# Patient Record
Sex: Male | Born: 1965 | Race: White | Hispanic: No | Marital: Married | Smoking: Former smoker
Health system: Southern US, Community
[De-identification: ages and names within clinical notes are randomized; demographics above are authoritative.]

## PROBLEM LIST (undated history)

## (undated) DIAGNOSIS — E079 Disorder of thyroid, unspecified: Secondary | ICD-10-CM

## (undated) DIAGNOSIS — E7800 Pure hypercholesterolemia, unspecified: Secondary | ICD-10-CM

## (undated) HISTORY — DX: Disorder of thyroid, unspecified: E07.9

---

## 2002-10-29 ENCOUNTER — Encounter: Payer: Self-pay | Admitting: Family Medicine

## 2002-10-29 ENCOUNTER — Ambulatory Visit (HOSPITAL_COMMUNITY): Admission: RE | Admit: 2002-10-29 | Discharge: 2002-10-29 | Payer: Self-pay | Admitting: Family Medicine

## 2005-06-19 ENCOUNTER — Ambulatory Visit (HOSPITAL_COMMUNITY): Admission: RE | Admit: 2005-06-19 | Discharge: 2005-06-19 | Payer: Self-pay | Admitting: Unknown Physician Specialty

## 2006-08-04 ENCOUNTER — Ambulatory Visit (HOSPITAL_COMMUNITY): Admission: RE | Admit: 2006-08-04 | Discharge: 2006-08-04 | Payer: Self-pay | Admitting: Family Medicine

## 2008-08-19 ENCOUNTER — Ambulatory Visit (HOSPITAL_COMMUNITY): Admission: RE | Admit: 2008-08-19 | Discharge: 2008-08-19 | Payer: Self-pay | Admitting: Family Medicine

## 2010-02-08 ENCOUNTER — Emergency Department (HOSPITAL_COMMUNITY): Admission: EM | Admit: 2010-02-08 | Discharge: 2010-02-08 | Payer: Self-pay | Admitting: Emergency Medicine

## 2010-02-20 ENCOUNTER — Encounter (HOSPITAL_COMMUNITY): Admission: RE | Admit: 2010-02-20 | Discharge: 2010-03-22 | Payer: Self-pay | Admitting: Preventative Medicine

## 2010-05-27 ENCOUNTER — Ambulatory Visit (HOSPITAL_COMMUNITY): Admission: RE | Admit: 2010-05-27 | Discharge: 2010-05-28 | Payer: Self-pay | Admitting: Specialist

## 2011-02-14 LAB — PROTIME-INR: INR: 0.99 (ref 0.00–1.49)

## 2011-02-14 LAB — COMPREHENSIVE METABOLIC PANEL
AST: 20 U/L (ref 0–37)
Alkaline Phosphatase: 65 U/L (ref 39–117)
Chloride: 105 mEq/L (ref 96–112)
Creatinine, Ser: 1.08 mg/dL (ref 0.4–1.5)
GFR calc non Af Amer: >60 mL/min (ref >60)
Potassium: 4.3 mEq/L (ref 3.5–5.1)
Sodium: 139 mEq/L (ref 135–145)
Total Bilirubin: 0.7 mg/dL (ref 0.3–1.2)
Total Protein: 7.1 g/dL (ref 6.0–8.3)

## 2011-02-14 LAB — APTT: aPTT: 27 seconds (ref 24–37)

## 2011-02-14 LAB — URINALYSIS, ROUTINE W REFLEX MICROSCOPIC
Bilirubin Urine: NEGATIVE
Glucose, UA: NEGATIVE mg/dL
Ketones, ur: NEGATIVE mg/dL
Nitrite: NEGATIVE
Specific Gravity, Urine: 1.013 (ref 1.005–1.030)
pH: 5 (ref 5.0–8.0)

## 2011-02-14 LAB — CBC
MCV: 89.8 fL (ref 78.0–100.0)
WBC: 6.9 10*3/uL (ref 4.0–10.5)

## 2012-09-08 ENCOUNTER — Ambulatory Visit: Payer: Self-pay

## 2012-09-08 ENCOUNTER — Other Ambulatory Visit: Payer: Self-pay | Admitting: Occupational Medicine

## 2012-09-08 DIAGNOSIS — Z0000 Encounter for general adult medical examination without abnormal findings: Secondary | ICD-10-CM

## 2014-11-20 ENCOUNTER — Encounter (HOSPITAL_COMMUNITY): Payer: Self-pay | Admitting: Family Medicine

## 2014-11-20 ENCOUNTER — Emergency Department (HOSPITAL_COMMUNITY)
Admission: EM | Admit: 2014-11-20 | Discharge: 2014-11-20 | Disposition: A | Discharge status: Home / Self Care | Payer: BC Managed Care – PPO | Attending: Emergency Medicine | Admitting: Emergency Medicine

## 2014-11-20 ENCOUNTER — Other Ambulatory Visit: Payer: Self-pay

## 2014-11-20 ENCOUNTER — Emergency Department (HOSPITAL_COMMUNITY): Payer: BC Managed Care – PPO

## 2014-11-20 DIAGNOSIS — R112 Nausea with vomiting, unspecified: Secondary | ICD-10-CM | POA: Insufficient documentation

## 2014-11-20 DIAGNOSIS — R0602 Shortness of breath: Secondary | ICD-10-CM | POA: Insufficient documentation

## 2014-11-20 DIAGNOSIS — R61 Generalized hyperhidrosis: Secondary | ICD-10-CM | POA: Diagnosis not present

## 2014-11-20 DIAGNOSIS — R0789 Other chest pain: Secondary | ICD-10-CM | POA: Insufficient documentation

## 2014-11-20 DIAGNOSIS — R079 Chest pain, unspecified: Secondary | ICD-10-CM

## 2014-11-20 DIAGNOSIS — Z8639 Personal history of other endocrine, nutritional and metabolic disease: Secondary | ICD-10-CM | POA: Diagnosis not present

## 2014-11-20 DIAGNOSIS — Z720 Tobacco use: Secondary | ICD-10-CM | POA: Insufficient documentation

## 2014-11-20 HISTORY — DX: Pure hypercholesterolemia, unspecified: E78.00

## 2014-11-20 LAB — I-STAT TROPONIN, ED
TROPONIN I, POC: 0 ng/mL (ref 0.00–0.08)
Troponin i, poc: 0 ng/mL (ref 0.00–0.08)

## 2014-11-20 LAB — CBC
HCT: 46.1 % (ref 39.0–52.0)
Hemoglobin: 15.9 g/dL (ref 13.0–17.0)
MCH: 30.5 pg (ref 26.0–34.0)
MCHC: 34.5 g/dL (ref 30.0–36.0)
MCV: 88.5 fL (ref 78.0–100.0)
Platelets: 190 10*3/uL (ref 150–400)
RBC: 5.21 MIL/uL (ref 4.22–5.81)
RDW: 12.3 % (ref 11.5–15.5)
WBC: 8.8 10*3/uL (ref 4.0–10.5)

## 2014-11-20 LAB — BASIC METABOLIC PANEL
ANION GAP: 8 (ref 5–15)
BUN: 14 mg/dL (ref 6–23)
CHLORIDE: 107 meq/L (ref 96–112)
CO2: 25 mmol/L (ref 19–32)
CREATININE: 1.08 mg/dL (ref 0.50–1.35)
Calcium: 9.5 mg/dL (ref 8.4–10.5)
GFR calc non Af Amer: 79 mL/min — ABNORMAL LOW (ref >90)
GLUCOSE: 95 mg/dL (ref 70–99)
POTASSIUM: 4.5 mmol/L (ref 3.5–5.1)
SODIUM: 140 mmol/L (ref 135–145)

## 2014-11-20 MED ORDER — ASPIRIN 325 MG PO TABS
325.0000 mg | ORAL_TABLET | Freq: Once | ORAL | Status: AC
  Administered 2014-11-20: 325 mg via ORAL
  Filled 2014-11-20: qty 1

## 2014-11-20 NOTE — ED Notes (Signed)
Pt having cental chest pain that woke him up at 3 am this morning. sts some SOB, N,V this am. sts the pain has eased off now around a 2. sts pain radiated into left shoulder blade.

## 2014-11-20 NOTE — ED Provider Notes (Signed)
CSN: [id number]     Arrival date & time 11/20/14  [id number] History   None    Chief Complaint  Patient presents with  . Chest Pain     (Consider location/radiation/quality/duration/timing/severity/associated sxs/prior Treatment) HPI  Pt is a 48yo male presenting to ED with c/o centralized chest pain that woke him from his sleep this morning around 3AM. Pain was severe, sharp and stabbing, did not radiate initially but as pain eased off into a dull ache, pain radiated into left arm.  Reports associated diaphoresis and SOB at onset, nausea and vomiting x1.  Pain is 2/10 at this time but initially severe pain seemed to last about 3 hours. Reports hx of similar chest pain 1 week ago but pain resolved on its own, he did not seek medical attention at that time.  Denies known personal or family hx of CAD.  Hx of high triglycerides. Denies HTN or diabetes. Pt is a current daily cigaret smoker. Denies recent cough, congestion, fever, or increased stress. Denies hx of panic attacks or sleep apnea.   Past Medical History  Diagnosis Date  . High cholesterol    History reviewed. No pertinent past surgical history. History reviewed. No pertinent family history. History  Substance Use Topics  . Smoking status: Current Every Day Smoker  . Smokeless tobacco: Not on file  . Alcohol Use: No    Review of Systems  Constitutional: Positive for diaphoresis. Negative for fever and chills.  Respiratory: Positive for shortness of breath. Negative for cough.   Cardiovascular: Positive for chest pain. Negative for palpitations and leg swelling.  Gastrointestinal: Positive for nausea and vomiting ( x1). Negative for abdominal pain, diarrhea and constipation.  Musculoskeletal: Negative for myalgias and back pain.  All other systems reviewed and are negative.     Allergies  Review of patient's allergies indicates no known allergies.  Home Medications   Prior to Admission medications   Not on File   BP  137/82 mmHg  Pulse 83  Temp(Src) 97.9 F (36.6 C) (Oral)  Resp 20  Ht 6\' 4"  (1.93 m)  Wt 235 lb (106.595 kg)  BMI 28.62 kg/m2  SpO2 98% Physical Exam  Constitutional: He appears well-developed and well-nourished.  Pt lying comfortably in exam bed, NAD.   HENT:  Head: Normocephalic and atraumatic.  Eyes: Conjunctivae are normal. No scleral icterus.  Neck: Normal range of motion.  Cardiovascular: Normal rate, regular rhythm and normal heart sounds.   Regular rate and rhythm  Pulmonary/Chest: Effort normal and breath sounds normal. No respiratory distress. He has no wheezes. He has no rales. He exhibits no tenderness.  No respiratory distress, able to speak in full sentences w/o difficulty. Lungs: CTAB  Abdominal: Soft. Bowel sounds are normal. He exhibits no distension and no mass. There is no tenderness. There is no rebound and no guarding.  Musculoskeletal: Normal range of motion.  Neurological: He is alert.  Skin: Skin is warm and dry.  Nursing note and vitals reviewed.   ED Course  Procedures (including critical care time) Labs Review Labs Reviewed  CBC  BASIC METABOLIC PANEL  I-STAT TROPOININ, ED    Imaging Review No results found.   EKG Interpretation None      MDM   Final diagnoses:  Chest pain    Pt is a 48yo male presenting to ED with centralized chest pain, associated diaphoresis, n/v. Hx of similar chest pain 1 week ago.  Pt is low risk for major cardiac event based on HEART  score, however, will still perform cardiac workup with delta troponin. Doubt PE (PERC negative) or pneumonia. DDx: ACS, GERD, musculoskeletal, panic attack.    Pt given 324mg  aspirin upon arrival to ED.   EKG: unremarkable. Labs: WNL including istat troponin: negative for elevation.  Delta troponin: negative for elevation. Pt states he feels better and feels comfortable being discharged home. Pt hemodynamically stable for discharge home to f/u with PCP. Return precautions  provided. Pt verbalized understanding and agreement with tx plan.   Junius FinnerErin O'Malley, PA-C 11/20/14 1619  Rolan BuccoMelanie Belfi, MD 11/20/14 [id number]  Rolan BuccoMelanie Belfi, MD 11/20/14 1640

## 2015-09-06 IMAGING — DX DG CHEST 2V
2 series · 2 of 2 positions shown · non-contrast
Comparison: September 08, 2012

CLINICAL DATA: Left-sided sharp chest pain for 1 day

EXAM:
CHEST  2 VIEW

[chest pa]
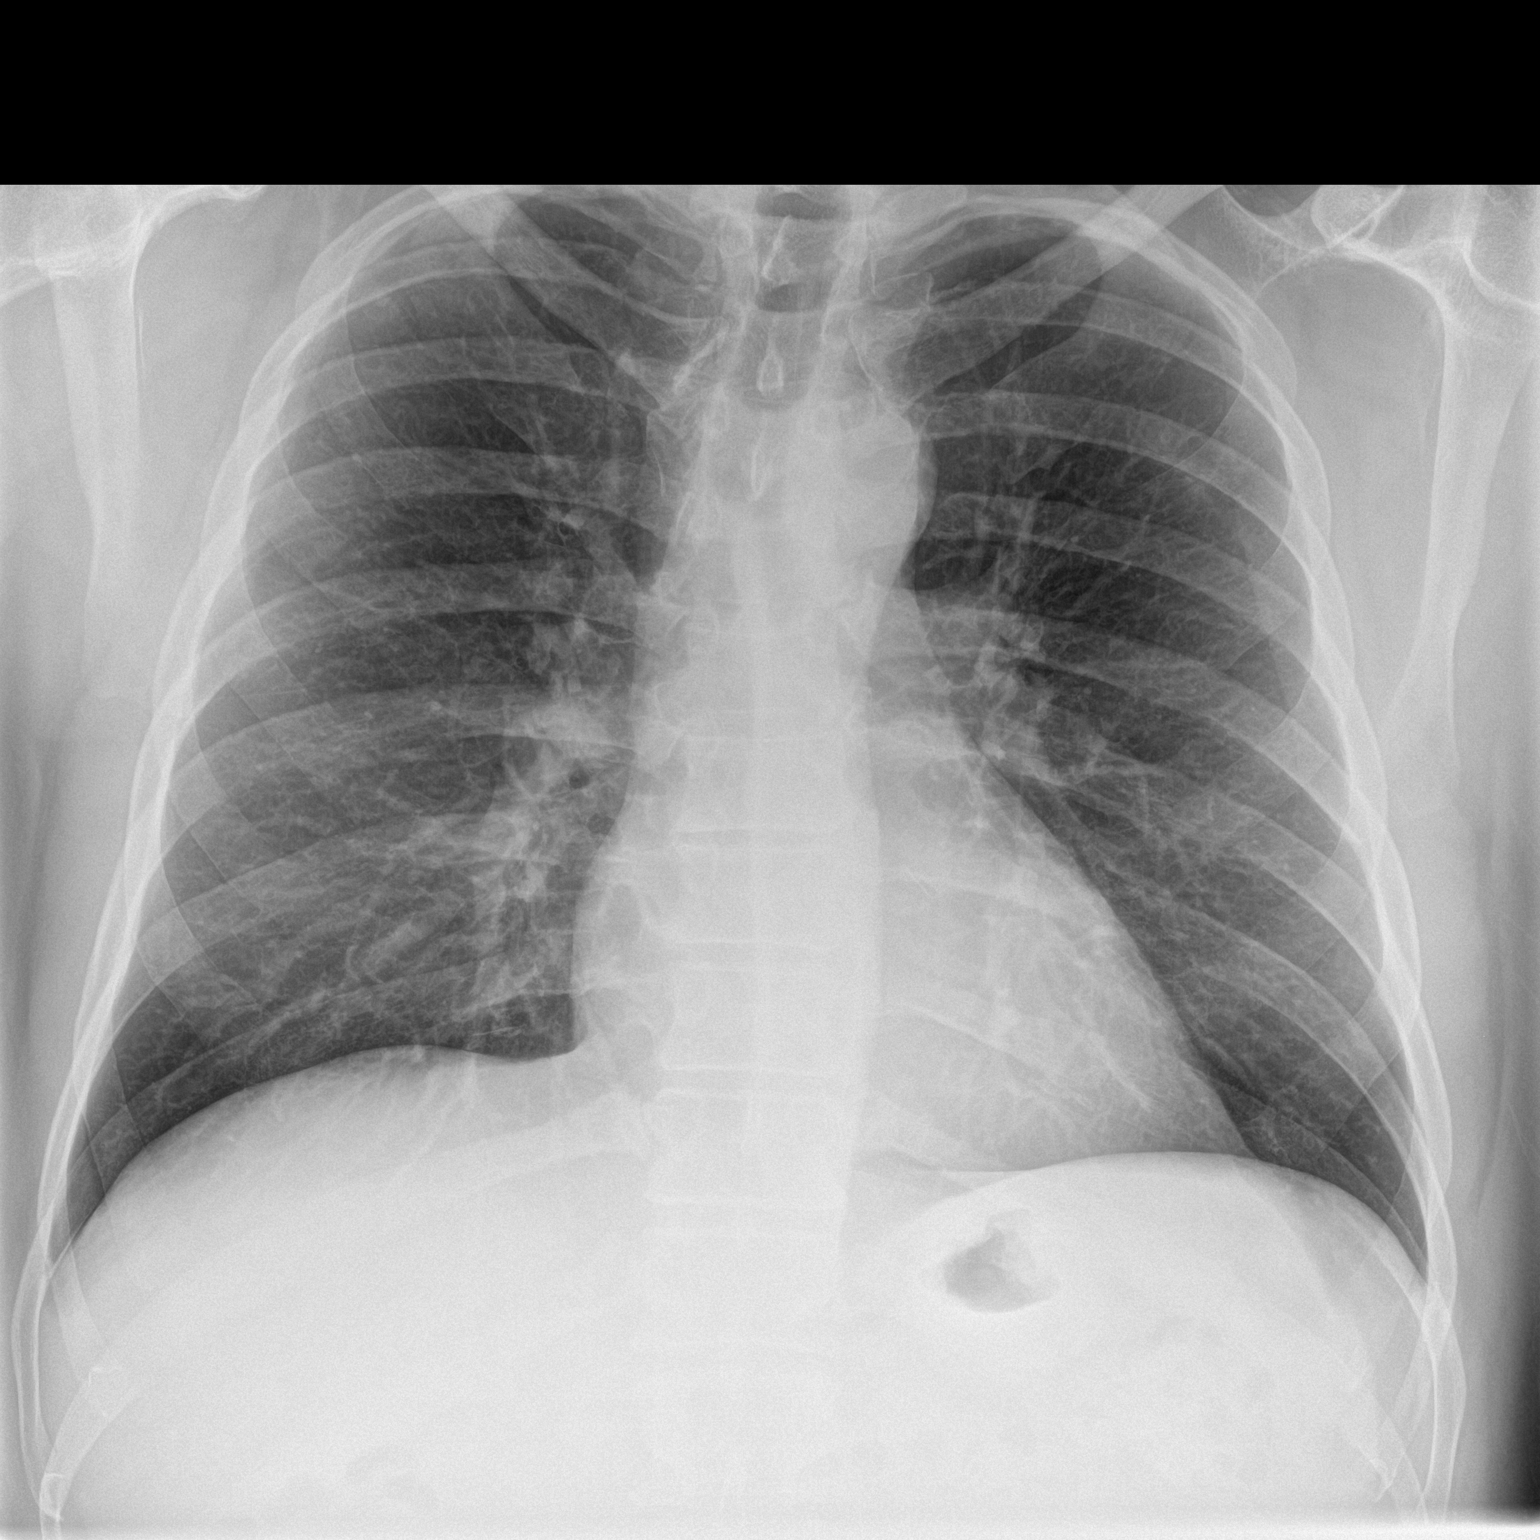

[chest lat]
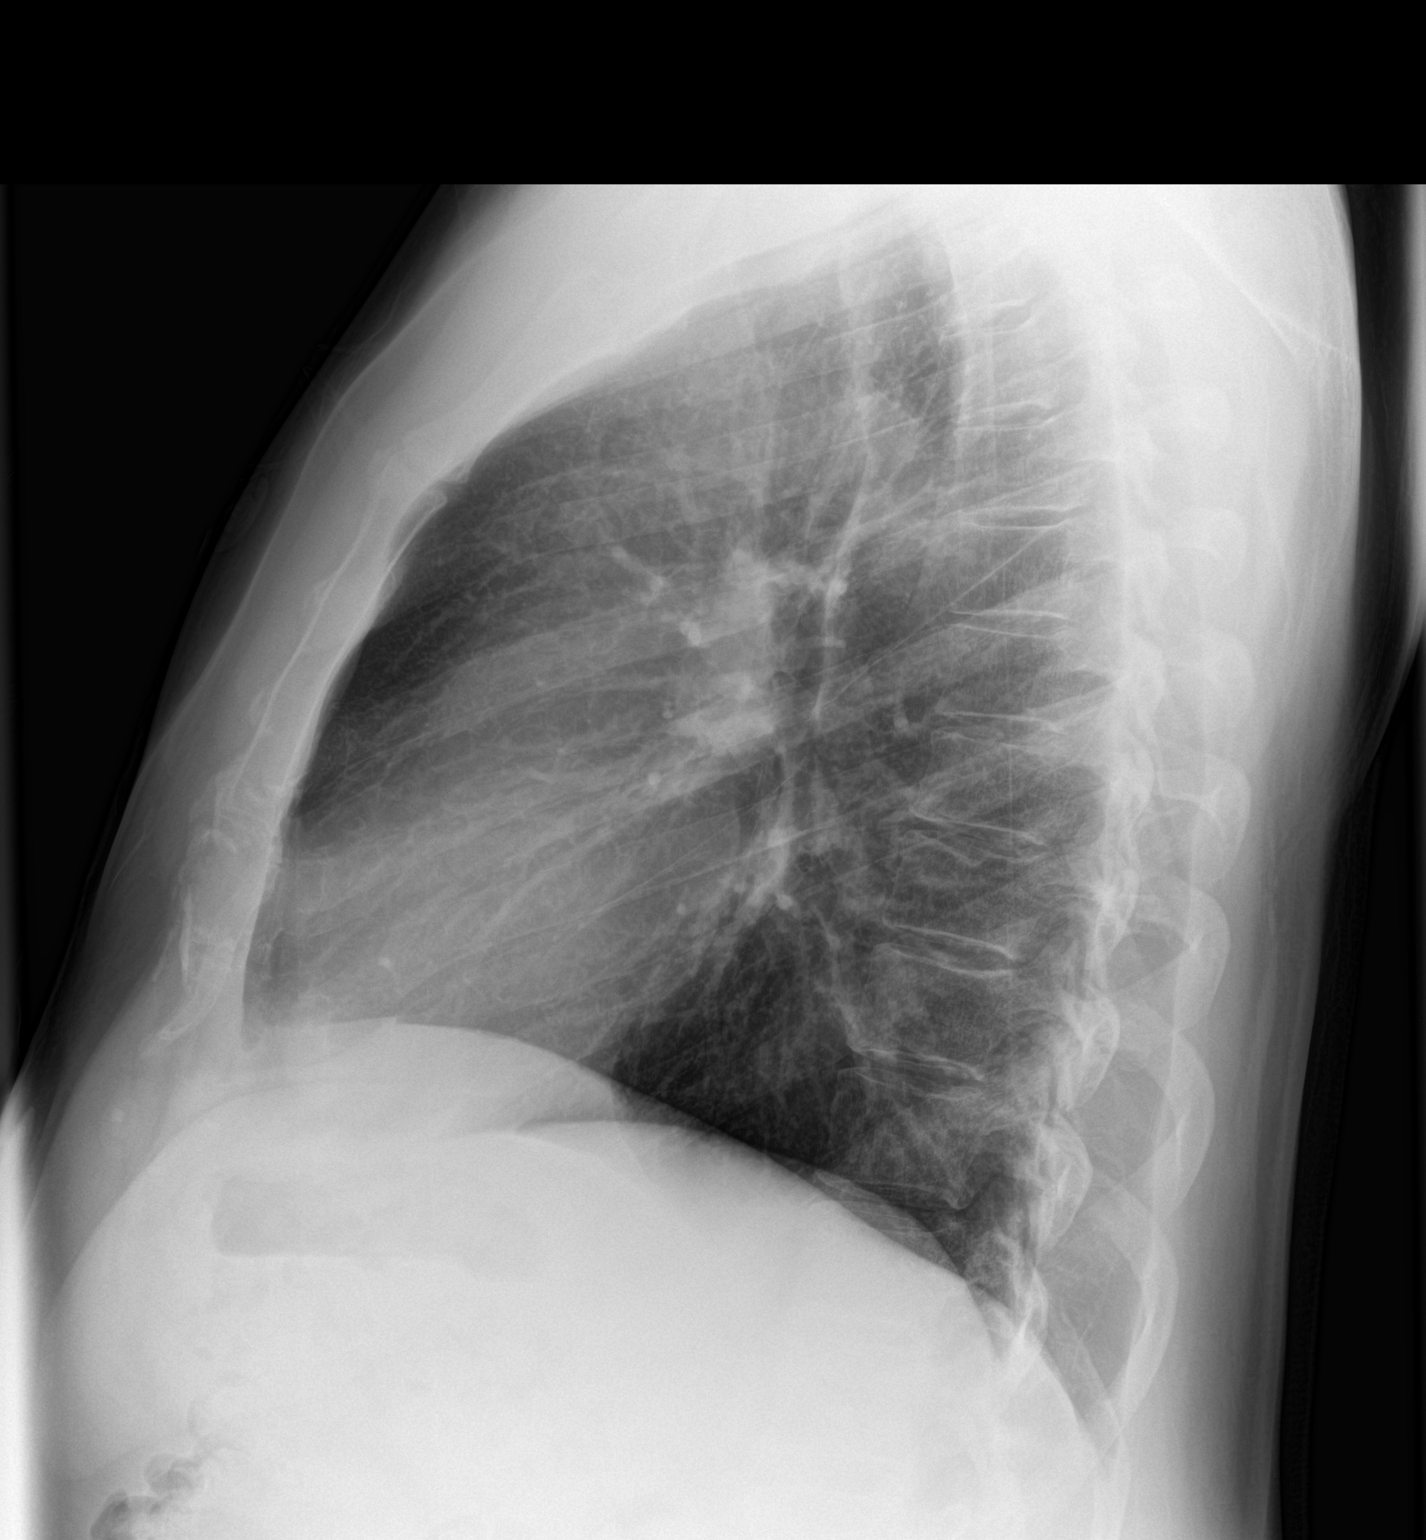

[2 of 2 positions shown; findings below may reference images not displayed]

FINDINGS: There is no edema or consolidation. The heart size and pulmonary
vascularity are normal. No adenopathy. No bone lesions.
IMPRESSION: No edema or consolidation.

## 2016-12-07 ENCOUNTER — Encounter (INDEPENDENT_AMBULATORY_CARE_PROVIDER_SITE_OTHER): Payer: Self-pay | Admitting: *Deleted

## 2018-05-22 DIAGNOSIS — R7301 Impaired fasting glucose: Secondary | ICD-10-CM | POA: Diagnosis not present

## 2018-05-22 DIAGNOSIS — H6122 Impacted cerumen, left ear: Secondary | ICD-10-CM | POA: Diagnosis not present

## 2018-05-22 DIAGNOSIS — Z0000 Encounter for general adult medical examination without abnormal findings: Secondary | ICD-10-CM | POA: Diagnosis not present

## 2018-05-22 DIAGNOSIS — E782 Mixed hyperlipidemia: Secondary | ICD-10-CM | POA: Diagnosis not present

## 2018-11-20 DIAGNOSIS — R7301 Impaired fasting glucose: Secondary | ICD-10-CM | POA: Diagnosis not present

## 2018-11-20 DIAGNOSIS — E871 Hypo-osmolality and hyponatremia: Secondary | ICD-10-CM | POA: Diagnosis not present

## 2018-11-20 DIAGNOSIS — E782 Mixed hyperlipidemia: Secondary | ICD-10-CM | POA: Diagnosis not present

## 2018-12-01 DIAGNOSIS — R7301 Impaired fasting glucose: Secondary | ICD-10-CM | POA: Diagnosis not present

## 2018-12-01 DIAGNOSIS — E782 Mixed hyperlipidemia: Secondary | ICD-10-CM | POA: Diagnosis not present

## 2018-12-01 DIAGNOSIS — F17200 Nicotine dependence, unspecified, uncomplicated: Secondary | ICD-10-CM | POA: Diagnosis not present

## 2019-01-05 DIAGNOSIS — E782 Mixed hyperlipidemia: Secondary | ICD-10-CM | POA: Diagnosis not present

## 2019-01-05 DIAGNOSIS — E6609 Other obesity due to excess calories: Secondary | ICD-10-CM | POA: Diagnosis not present

## 2019-01-05 DIAGNOSIS — F17200 Nicotine dependence, unspecified, uncomplicated: Secondary | ICD-10-CM | POA: Diagnosis not present

## 2019-03-01 DIAGNOSIS — M5136 Other intervertebral disc degeneration, lumbar region: Secondary | ICD-10-CM | POA: Diagnosis not present

## 2019-03-01 DIAGNOSIS — M545 Low back pain: Secondary | ICD-10-CM | POA: Diagnosis not present

## 2019-03-01 DIAGNOSIS — M5126 Other intervertebral disc displacement, lumbar region: Secondary | ICD-10-CM | POA: Diagnosis not present

## 2019-03-07 DIAGNOSIS — E669 Obesity, unspecified: Secondary | ICD-10-CM | POA: Diagnosis not present

## 2019-03-07 DIAGNOSIS — M545 Low back pain: Secondary | ICD-10-CM | POA: Diagnosis not present

## 2019-03-08 DIAGNOSIS — M545 Low back pain: Secondary | ICD-10-CM | POA: Diagnosis not present

## 2019-03-15 DIAGNOSIS — M545 Low back pain: Secondary | ICD-10-CM | POA: Diagnosis not present

## 2019-03-15 DIAGNOSIS — M5136 Other intervertebral disc degeneration, lumbar region: Secondary | ICD-10-CM | POA: Diagnosis not present

## 2019-03-15 DIAGNOSIS — M519 Unspecified thoracic, thoracolumbar and lumbosacral intervertebral disc disorder: Secondary | ICD-10-CM | POA: Diagnosis not present

## 2019-03-23 DIAGNOSIS — M5416 Radiculopathy, lumbar region: Secondary | ICD-10-CM | POA: Diagnosis not present

## 2019-03-29 DIAGNOSIS — Z9889 Other specified postprocedural states: Secondary | ICD-10-CM | POA: Diagnosis not present

## 2019-03-29 DIAGNOSIS — M5416 Radiculopathy, lumbar region: Secondary | ICD-10-CM | POA: Diagnosis not present

## 2019-03-29 DIAGNOSIS — M519 Unspecified thoracic, thoracolumbar and lumbosacral intervertebral disc disorder: Secondary | ICD-10-CM | POA: Diagnosis not present

## 2020-12-29 DIAGNOSIS — L400 Psoriasis vulgaris: Secondary | ICD-10-CM | POA: Diagnosis not present

## 2020-12-29 DIAGNOSIS — Z79899 Other long term (current) drug therapy: Secondary | ICD-10-CM | POA: Diagnosis not present

## 2021-01-06 DIAGNOSIS — L400 Psoriasis vulgaris: Secondary | ICD-10-CM | POA: Diagnosis not present

## 2021-02-06 DIAGNOSIS — Z79899 Other long term (current) drug therapy: Secondary | ICD-10-CM | POA: Diagnosis not present

## 2021-02-06 DIAGNOSIS — L400 Psoriasis vulgaris: Secondary | ICD-10-CM | POA: Diagnosis not present

## 2021-02-13 DIAGNOSIS — E663 Overweight: Secondary | ICD-10-CM | POA: Diagnosis not present

## 2021-02-13 DIAGNOSIS — E782 Mixed hyperlipidemia: Secondary | ICD-10-CM | POA: Diagnosis not present

## 2021-02-13 DIAGNOSIS — G4700 Insomnia, unspecified: Secondary | ICD-10-CM | POA: Diagnosis not present

## 2021-02-13 DIAGNOSIS — K219 Gastro-esophageal reflux disease without esophagitis: Secondary | ICD-10-CM | POA: Diagnosis not present

## 2021-02-25 DIAGNOSIS — L400 Psoriasis vulgaris: Secondary | ICD-10-CM | POA: Diagnosis not present

## 2021-03-27 DIAGNOSIS — E663 Overweight: Secondary | ICD-10-CM | POA: Diagnosis not present

## 2021-03-27 DIAGNOSIS — Z0000 Encounter for general adult medical examination without abnormal findings: Secondary | ICD-10-CM | POA: Diagnosis not present

## 2021-03-27 DIAGNOSIS — E782 Mixed hyperlipidemia: Secondary | ICD-10-CM | POA: Diagnosis not present

## 2021-03-27 DIAGNOSIS — G4700 Insomnia, unspecified: Secondary | ICD-10-CM | POA: Diagnosis not present

## 2021-03-27 DIAGNOSIS — R7989 Other specified abnormal findings of blood chemistry: Secondary | ICD-10-CM | POA: Diagnosis not present

## 2021-04-09 NOTE — Progress Notes (Signed)
04/10/21- 54 yoM former Smoker for sleep evaluation courtesy of [employer], NP.with concern of loud snoring. Medical problem list includes  Hypercholesterolemia, Restless Legs, GERD, Overweight,  Epworth score- 4 Body weight today- 228 lbs Covid vax- J&J -----Patient states that he gets cramps in his legs at night and also wakes up 2-3 times a night.  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  He is told he snores if on back. Denies parsomnias- licking/ punching/ sleep walking.  Admits daytime tiredness. No naps. Clonazepam helps sleep and limb jerks No ENT surgery, heart or lung condition.   Prior to Admission medications   Medication Sig Start Date End Date Taking? Authorizing Provider  Apremilast 30 MG TABS Take 1 tablet by mouth daily.   Yes [provider]  atorvastatin (LIPITOR) 20 MG tablet Take 1 tablet by mouth daily. 03/18/21  Yes [provider]  clonazePAM (KLONOPIN) 0.5 MG tablet Take 0.5 mg by mouth daily as needed. 03/18/21  Yes [provider]  Fenofibrate Micronized 90 MG CAPS Take 1 tablet by mouth daily.   Yes [provider]  ibuprofen (ADVIL) 200 MG tablet Take 400 mg by mouth every 8 (eight) hours as needed for headache (pain).   Yes [provider]  levothyroxine (SYNTHROID) 25 MCG tablet Take 25 mcg by mouth daily. 03/30/21  Yes [provider]  MAGNESIUM PO Take 1 tablet by mouth daily.   Yes [provider]  Omega-3 Fatty Acids (FISH OIL PO) Take 1 capsule by mouth daily.   Yes [provider]  Pseudoephedrine-Naproxen Na (ALEVE-D SINUS & COLD PO) Take 1 tablet by mouth daily as needed (sinus congestion).   Yes [provider]  rOPINIRole (REQUIP) 1 MG tablet Take 1 mg by mouth daily. 10/30/14  Yes [provider]   Past Medical History:  Diagnosis Date  . High cholesterol    History reviewed. No pertinent surgical  history. History reviewed. No pertinent family history. Social History   Socioeconomic History  . Marital status: Married    Spouse name: Not on file  . Number of children: Not on file  . Years of education: Not on file  . Highest education level: Not on file  Occupational History  . Not on file  Tobacco Use  . Smoking status: Former Smoker    Packs/day: 1.00    Types: Cigarettes    Quit date: 04/10/2018    Years since quitting: 3.0  . Smokeless tobacco: Never Used  Vaping Use  . Vaping Use: Never used  Substance and Sexual Activity  . Alcohol use: No  . Drug use: Not on file  . Sexual activity: Not on file  Other Topics Concern  . Not on file  Social History Narrative  . Not on file   Social Determinants of Health   Financial Resource Strain: Not on file  Food Insecurity: Not on file  Transportation Needs: Not on file  Physical Activity: Not on file  Stress: Not on file  Social Connections: Not on file  Intimate Partner Violence: Not on file   ROS-see HPI   + = positive Constitutional:    weight loss, night sweats, fevers, chills, +fatigue, lassitude. HEENT:    headaches, difficulty swallowing, tooth/dental problems, sore throat,       sneezing, itching, ear ache, nasal congestion, post nasal drip, snoring CV:    chest pain, orthopnea, PND, swelling in lower extremities, anasarca,  dizziness, palpitations Resp:   shortness of breath with exertion or at rest.                productive cough,   non-productive cough, coughing up of blood.              change in color of mucus.  wheezing.   Skin:    rash or lesions. GI:  No-   heartburn, indigestion, abdominal pain, nausea, vomiting, diarrhea,                 change in bowel habits, loss of appetite GU: dysuria, change in color of urine, no urgency or frequency.   flank pain. MS:   joint pain, stiffness, decreased range of motion, back pain. Neuro-     nothing unusual Psych:  change in  mood or affect.  depression or anxiety.   memory loss.  OBJ- Physical Exam General- Alert, Oriented, Affect-appropriate, Distress- none acute, Tall Skin- rash-none, lesions- none, excoriation- none Lymphadenopathy- none Head- atraumatic            Eyes- Gross vision intact, PERRLA, conjunctivae and secretions clear            Ears- Hearing, canals-normal            Nose- Clear, no-Septal dev, mucus, polyps, erosion, perforation             Throat- Mallampati III , mucosa clear , drainage- none, tonsils- atrophic, + teeth  Neck- flexible , trachea midline, no stridor , thyroid nl, carotid no bruit Chest - symmetrical excursion , unlabored           Heart/CV- RRR , no murmur , no gallop  , no rub, nl s1 s2                           - JVD- none , edema- none, stasis changes- none, varices- none           Lung- clear to P&A, wheeze- none, cough- none , dullness-none, rub- none           Chest wall-  Abd-  Br/ Gen/ Rectal- Not done, not indicated Extrem- cyanosis- none, clubbing, none, atrophy- none, strength- nl Neuro- grossly intact to observation

## 2021-04-10 ENCOUNTER — Other Ambulatory Visit: Payer: Self-pay

## 2021-04-10 ENCOUNTER — Encounter: Payer: Self-pay | Admitting: Internal Medicine

## 2021-04-10 ENCOUNTER — Ambulatory Visit (INDEPENDENT_AMBULATORY_CARE_PROVIDER_SITE_OTHER): Payer: BLUE CROSS/BLUE SHIELD | Admitting: Internal Medicine

## 2021-04-10 VITALS — BP 118/80 | HR 83 | Temp 98.3°F | Ht 76.0 in | Wt 228.8 lb

## 2021-04-10 DIAGNOSIS — G2581 Restless legs syndrome: Secondary | ICD-10-CM

## 2021-04-10 DIAGNOSIS — R0683 Snoring: Secondary | ICD-10-CM

## 2021-04-10 NOTE — Patient Instructions (Signed)
Order- schedule home sleep test   Dx Snoring  Please cal [place] for results about 2 weeks after your sleep test. If appropriate, we may be able to start treatment before we see you next.

## 2021-04-12 ENCOUNTER — Encounter: Payer: Self-pay | Admitting: Internal Medicine

## 2021-04-12 DIAGNOSIS — G2581 Restless legs syndrome: Secondary | ICD-10-CM | POA: Insufficient documentation

## 2021-04-12 DIAGNOSIS — R0683 Snoring: Secondary | ICD-10-CM | POA: Insufficient documentation

## 2021-04-12 NOTE — Assessment & Plan Note (Signed)
Suspect obstructive sleep apnea. Appropriate discussion and questions answered. Plan- sleep study, then maybe CPAP

## 2021-04-12 NOTE — Assessment & Plan Note (Signed)
Clonazepam seems to be working pretty well. He has occasional discrete cramp that is different.

## 2021-05-20 ENCOUNTER — Other Ambulatory Visit: Payer: Self-pay | Admitting: Internal Medicine

## 2021-08-12 ENCOUNTER — Ambulatory Visit: Payer: BLUE CROSS/BLUE SHIELD | Admitting: Internal Medicine

## 2021-09-30 DIAGNOSIS — L400 Psoriasis vulgaris: Secondary | ICD-10-CM | POA: Diagnosis not present

## 2021-10-16 DIAGNOSIS — K219 Gastro-esophageal reflux disease without esophagitis: Secondary | ICD-10-CM | POA: Diagnosis not present

## 2021-10-16 DIAGNOSIS — R7303 Prediabetes: Secondary | ICD-10-CM | POA: Diagnosis not present

## 2021-10-16 DIAGNOSIS — R040 Epistaxis: Secondary | ICD-10-CM | POA: Diagnosis not present

## 2021-10-16 DIAGNOSIS — E039 Hypothyroidism, unspecified: Secondary | ICD-10-CM | POA: Diagnosis not present

## 2021-10-16 DIAGNOSIS — E782 Mixed hyperlipidemia: Secondary | ICD-10-CM | POA: Diagnosis not present

## 2022-04-16 DIAGNOSIS — E039 Hypothyroidism, unspecified: Secondary | ICD-10-CM | POA: Diagnosis not present

## 2022-04-16 DIAGNOSIS — Z0000 Encounter for general adult medical examination without abnormal findings: Secondary | ICD-10-CM | POA: Diagnosis not present

## 2022-04-16 DIAGNOSIS — Z125 Encounter for screening for malignant neoplasm of prostate: Secondary | ICD-10-CM | POA: Diagnosis not present

## 2022-04-16 DIAGNOSIS — Z23 Encounter for immunization: Secondary | ICD-10-CM | POA: Diagnosis not present

## 2022-04-16 DIAGNOSIS — R7303 Prediabetes: Secondary | ICD-10-CM | POA: Diagnosis not present

## 2022-04-16 DIAGNOSIS — E782 Mixed hyperlipidemia: Secondary | ICD-10-CM | POA: Diagnosis not present

## 2022-06-23 ENCOUNTER — Other Ambulatory Visit: Payer: Self-pay | Admitting: *Deleted

## 2022-06-23 DIAGNOSIS — I8393 Asymptomatic varicose veins of bilateral lower extremities: Secondary | ICD-10-CM

## 2022-07-15 ENCOUNTER — Ambulatory Visit (INDEPENDENT_AMBULATORY_CARE_PROVIDER_SITE_OTHER): Payer: BC Managed Care – PPO | Admitting: Physician Assistant

## 2022-07-15 ENCOUNTER — Encounter: Payer: Self-pay | Admitting: Physician Assistant

## 2022-07-15 ENCOUNTER — Ambulatory Visit (HOSPITAL_COMMUNITY)
Admission: RE | Admit: 2022-07-15 | Discharge: 2022-07-15 | Disposition: A | Discharge status: Home / Self Care | Payer: BC Managed Care – PPO | Source: Ambulatory Visit | Attending: Vascular Surgery | Admitting: Vascular Surgery

## 2022-07-15 VITALS — BP 108/73 | HR 100 | Temp 97.9°F | Resp 20 | Ht 76.0 in | Wt 215.0 lb

## 2022-07-15 DIAGNOSIS — I8393 Asymptomatic varicose veins of bilateral lower extremities: Secondary | ICD-10-CM | POA: Diagnosis not present

## 2022-07-15 NOTE — Progress Notes (Signed)
[facility] [place] [facility]   Reason for referral: Swollen varicosities right  leg  History of Present Illness  Timothy Avery is a 56 y.o. male who presents with chief complaint: swollen leg.  Patient notes, onset of swelling varicosities 5-6 years ago , associated with aching and cramping nightly.  The patient has had no history of DVT, positive history of varicose vein, no history of venous stasis ulcers, no history of  Lymphedema and no history of skin changes in lower legs.  There is unknown family history of venous disorders.  The patient has not used compression stockings in the past.  Past medical history of Hypercholesterolemia, Restless Legs, GERD.  He has lost 50-60 lbs through eating right and exercise.    Past Medical History:  Diagnosis Date   High cholesterol    Thyroid disease     History reviewed. No pertinent surgical history.  Social History   Socioeconomic History   Marital status: Married    Spouse name: Not on file   Number of children: Not on file   Years of education: Not on file   Highest education level: Not on file  Occupational History   Not on file  Tobacco Use   Smoking status: Former    Packs/day: 1.00    Types: Cigarettes    Quit date: 04/10/2018    Years since quitting: 4.2   Smokeless tobacco: Never  Vaping Use   Vaping Use: Never used  Substance and Sexual Activity   Alcohol use: No   Drug use: Not on file   Sexual activity: Not on file  Other Topics Concern   Not on file  Social History Narrative   Not on file   Social Determinants of Health   Financial Resource Strain: Not on file  Food Insecurity: Not on file  Transportation Needs: Not on file  Physical Activity: Not on file  Stress: Not on file  Social Connections: Not on file  Intimate Partner Violence: Not on file    History reviewed. No pertinent family history.  Current Outpatient Medications on File Prior to Visit  Medication Sig Dispense Refill    Apremilast 30 MG TABS Take 1 tablet by mouth daily.     atorvastatin (LIPITOR) 20 MG tablet Take 1 tablet by mouth daily.     clobetasol cream (TEMOVATE) 0.05 % Apply 1 Application topically 2 (two) times daily.     clonazePAM (KLONOPIN) 0.5 MG tablet Take 0.5 mg by mouth daily as needed.     diazepam (VALIUM) 10 MG tablet Take 10 mg by mouth daily as needed.     Fenofibrate Micronized 90 MG CAPS Take 1 tablet by mouth daily.     levothyroxine (SYNTHROID) 50 MCG tablet Take 50 mcg by mouth daily.     MAGNESIUM PO Take 1 tablet by mouth daily.     Omega-3 Fatty Acids (FISH OIL PO) Take 1 capsule by mouth daily.     omeprazole (PRILOSEC) 40 MG capsule Take 40 mg by mouth daily.     phentermine (ADIPEX-P) 37.5 MG tablet Take 37.5 mg by mouth daily.     Pseudoephedrine-Naproxen Na (ALEVE-D SINUS & COLD PO) Take 1 tablet by mouth daily as needed (sinus congestion).     rOPINIRole (REQUIP) 1 MG tablet Take 1 mg by mouth daily.  0   No current facility-administered medications on file prior to visit.    Allergies as of 07/15/2022   (No Known Allergies)  ROS:   General:  No weight loss, Fever, chills  HEENT: No recent headaches, no nasal bleeding, no visual changes, no sore throat  Neurologic: No dizziness, blackouts, seizures. No recent symptoms of stroke or mini- stroke. No recent episodes of slurred speech, or temporary blindness.  Cardiac: No recent episodes of chest pain/pressure, no shortness of breath at rest.  No shortness of breath with exertion.  Denies history of atrial fibrillation or irregular heartbeat  Vascular: No history of rest pain in feet.  No history of claudication.  No history of non-healing ulcer, No history of DVT   Pulmonary: No home oxygen, no productive cough, no hemoptysis,  No asthma or wheezing  Musculoskeletal:  [ ]  Arthritis, [ ]  Low back pain,  [ ]  Joint pain  Hematologic:No history of hypercoagulable state.  No history of easy bleeding.  No history  of anemia  Gastrointestinal: No hematochezia or melena,  No gastroesophageal reflux, no trouble swallowing  Urinary: [ ]  chronic Kidney disease, [ ]  on HD - [ ]  MWF or [ ]  TTHS, [ ]  Burning with urination, [ ]  Frequent urination, [ ]  Difficulty urinating;   Skin: No rashes  Psychological: No history of anxiety,  No history of depression  Physical Examination  Vitals:   07/15/22 1205  BP: 108/73  Pulse: 100  Resp: 20  Temp: 97.9 F (36.6 C)  SpO2: 99%  Weight: 215 lb (97.5 kg)  Height: 6\' 4"  (1.93 m)    Body mass index is 26.17 kg/m.  General:  Alert and oriented, no acute distress HEENT: Normal Neck: No bruit or JVD Pulmonary: Clear to auscultation bilaterally Cardiac: Regular Rate and Rhythm without murmur Abdomen: Soft, non-tender, non-distended, no mass, no scars Skin: No rash     Extremity Pulses:  2+ radial, femoral, posterior tibial pulses bilaterally Musculoskeletal: No deformity or edema  Neurologic: Upper and lower extremity motor 5/5 and symmetric  DATA: Venous Reflux Times  +--------------+---------+------+-----------+------------+-----------------  ----+  RIGHT         Reflux NoRefluxReflux TimeDiameter cmsComments                                        Yes                                                 +--------------+---------+------+-----------+------------+-----------------  ----+  CFV                     yes   >1 second                                     +--------------+---------+------+-----------+------------+-----------------  ----+  FV mid        no                                                            +--------------+---------+------+-----------+------------+-----------------  ----+  Popliteal     no                                                            +--------------+---------+------+-----------+------------+-----------------  ----+  GSV at [facility]    no                             0.62                            +--------------+---------+------+-----------+------------+-----------------  ----+  GSV prox thigh          yes    >500 ms      0.49                            +--------------+---------+------+-----------+------------+-----------------  ----+  GSV mid thigh           yes    >500 ms      0.47    large branch off  of                                                        GSV in the  mid/distal                                                      thigh that  courses                                                         lateral                  +--------------+---------+------+-----------+------------+-----------------  ----+  GSV dist thighno                            0.24                            +--------------+---------+------+-----------+------------+-----------------  ----+  GSV at knee   no                            0.14                            +--------------+---------+------+-----------+------------+-----------------  ----+  SSV Pop Fossa no                            0.13                            +--------------+---------+------+-----------+------------+-----------------  ----+           Summary:  Right:  - No evidence of deep vein thrombosis from the common femoral through the  popliteal veins.  - No evidence of superficial venous thrombosis.  - The common femoral vein is not competent.  - The great saphenous vein is not competent.  - The small saphenous  vein is competent.      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  He denies significant edema.  He is active and eats a healthy diet.  The venous reflux study demonstrates no DVT.  There is proximal thigh GSV and mid thigh GSV reflux.    He will be placed in thigh high compression, cont. Exercising, healthy diet and elevation for edema if present.  He  will f/u in 3 months in our vein clinic.  I ordered a left LE reflux study for his f/u visit.      Mosetta Pigeon PA-C [facility] Office: [phone]  MD on call [facility]

## 2022-07-20 ENCOUNTER — Other Ambulatory Visit: Payer: Self-pay

## 2022-07-20 DIAGNOSIS — I8393 Asymptomatic varicose veins of bilateral lower extremities: Secondary | ICD-10-CM

## 2022-10-20 ENCOUNTER — Ambulatory Visit: Payer: BC Managed Care – PPO | Admitting: Vascular Surgery

## 2022-10-20 ENCOUNTER — Encounter (HOSPITAL_COMMUNITY): Payer: BC Managed Care – PPO

## 2022-12-15 ENCOUNTER — Encounter: Payer: Self-pay | Admitting: Vascular Surgery

## 2022-12-15 ENCOUNTER — Ambulatory Visit (INDEPENDENT_AMBULATORY_CARE_PROVIDER_SITE_OTHER): Payer: 59 | Admitting: Vascular Surgery

## 2022-12-15 ENCOUNTER — Ambulatory Visit (HOSPITAL_COMMUNITY)
Admission: RE | Admit: 2022-12-15 | Discharge: 2022-12-15 | Disposition: A | Discharge status: Home / Self Care | Payer: 59 | Source: Ambulatory Visit | Attending: Vascular Surgery | Admitting: Vascular Surgery

## 2022-12-15 VITALS — BP 118/74 | HR 72 | Temp 98.3°F | Resp 18 | Ht 75.5 in | Wt 220.4 lb

## 2022-12-15 DIAGNOSIS — I8393 Asymptomatic varicose veins of bilateral lower extremities: Secondary | ICD-10-CM

## 2022-12-15 DIAGNOSIS — I872 Venous insufficiency (chronic) (peripheral): Secondary | ICD-10-CM

## 2022-12-15 NOTE — Progress Notes (Signed)
REASON FOR VISIT:   52-month follow-up visit.  MEDICAL ISSUES:   CHRONIC VENOUS INSUFFICIENCY: This patient has CEAP C2 venous disease.  He is continuing to have significant symptoms despite conservative measures including thigh-high compression stockings with a gradient of 20 to 30 mmHg, leg elevation, and exercise.  This reason I think he would be a good candidate for laser ablation of the right great saphenous vein down to the junction of the mid and distal third of the thigh where the vein then gives off a large tributary which needs the varicosities in the anterior lateral thigh and leg.  I have discussed the indications for endovenous laser ablation of the right GSV, that is to lower the pressure in the veins and potentially help relieve the symptoms from venous hypertension.  I have also discussed alternative options such as conservative treatment as described above. I have discussed the potential complications of the procedure, including bleeding, bruising, leg swelling, deep venous thrombosis (<1% risk), or failure of the vein to close <1% risk).  I have also explained that venous insufficiency is a chronic disease, and that the patient is at risk for recurrent varicose veins in the future.  All of the patient's questions were encouraged and answered. They are agreeable to proceed.   I have discussed with the patient the indications for stab phlebectomy.  I have explained to the patient that that will have small scars from the stab incisions.  I explained that the other risks include bruising, bleeding, and phlebitis.    HPI:   Timothy Avery is a pleasant 57 y.o. 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  Venous duplex scan at that time showed evidence of some deep venous reflux in the common femoral vein and also superficial venous reflux in the right great saphenous vein in the proximal thigh which fed some large  varicosities along the lateral aspect of the right leg.  He was prescribed thigh-high compression stockings with a gradient of 20 to 30 mmHg, encouraged to elevate his legs and exercise.  He comes in for a 40-month follow-up visit.  The patient has been wearing his thigh-high compression stockings.  He is also been elevating his legs.  He continues to have significant aching pain and heaviness in the right leg which is aggravated by standing and sitting and relieved with elevation.  Has had no previous history of DVT.  He has some symptoms on the left but these are not as severe.  Past Medical History:  Diagnosis Date   High cholesterol    Thyroid disease     History reviewed. No pertinent family history.  SOCIAL HISTORY: Social History   Tobacco Use   Smoking status: Former    Packs/day: 1.00    Types: Cigarettes    Quit date: 04/10/2018    Years since quitting: 4.6   Smokeless tobacco: Never  Substance Use Topics   Alcohol use: No    No Known Allergies  Current Outpatient Medications  Medication Sig Dispense Refill   Apremilast 30 MG TABS Take 1 tablet by mouth daily.     atorvastatin (LIPITOR) 20 MG tablet Take 1 tablet by mouth daily.     clobetasol cream (TEMOVATE) 3.15 % Apply 1 Application topically 2 (two) times daily.     clonazePAM (KLONOPIN) 0.5 MG tablet Take 0.5 mg by mouth daily as needed.     diazepam (VALIUM) 10 MG  tablet Take 10 mg by mouth daily as needed.     Fenofibrate Micronized 90 MG CAPS Take 1 tablet by mouth daily.     levothyroxine (SYNTHROID) 50 MCG tablet Take 50 mcg by mouth daily.     MAGNESIUM PO Take 1 tablet by mouth daily.     Omega-3 Fatty Acids (FISH OIL PO) Take 1 capsule by mouth daily.     omeprazole (PRILOSEC) 40 MG capsule Take 40 mg by mouth daily.     phentermine (ADIPEX-P) 37.5 MG tablet Take 37.5 mg by mouth daily.     Pseudoephedrine-Naproxen Na (ALEVE-D SINUS & COLD PO) Take 1 tablet by mouth daily as needed (sinus congestion).      rOPINIRole (REQUIP) 1 MG tablet Take 1 mg by mouth daily.  0   No current facility-administered medications for this visit.    REVIEW OF SYSTEMS:  [X]  denotes positive finding, [ ]  denotes negative finding Cardiac  Comments:  Chest pain or chest pressure:    Shortness of breath upon exertion:    Short of breath when lying flat:    Irregular heart rhythm:        Vascular    Pain in calf, thigh, or hip brought on by ambulation:    Pain in feet at night that wakes you up from your sleep:     Blood clot in your veins:    Leg swelling:         Pulmonary    Oxygen at home:    Productive cough:     Wheezing:         Neurologic    Sudden weakness in arms or legs:     Sudden numbness in arms or legs:     Sudden onset of difficulty speaking or slurred speech:    Temporary loss of vision in one eye:     Problems with dizziness:         Gastrointestinal    Blood in stool:     Vomited blood:         Genitourinary    Burning when urinating:     Blood in urine:        Psychiatric    Major depression:         Hematologic    Bleeding problems:    Problems with blood clotting too easily:        Skin    Rashes or ulcers:        Constitutional    Fever or chills:     PHYSICAL EXAM:   Vitals:   12/15/22 1431  BP: 118/74  Pulse: 72  Resp: 18  Temp: 98.3 F (36.8 C)  TempSrc: Temporal  SpO2: 99%  Weight: 220 lb 6.4 oz (100 kg)  Height: 6' 3.5" (1.918 m)    GENERAL: The patient is a well-nourished male, in no acute distress. The vital signs are documented above. CARDIAC: There is a regular rate and rhythm.  VASCULAR: I do not detect carotid bruits.  On the right side he has a palpable posterior tibial pulse and brisk dorsalis pedis signal with the Doppler. On the left side he has a palpable dorsalis pedis and posterior tibial pulse. He has dilated varicose veins along the anterior lateral aspect of his thigh and leg.  He also has varicosities in his posterior  calf.      I did look at his great saphenous vein myself with the SonoSite.  I could clearly demonstrate reflux at the  saphenofemoral junction.  He has reflux down to the mid to distal thigh where this gives off a large tributary which feeds these varicosities along the anterolateral aspect of his right leg.  The vein is significantly dilated throughout. PULMONARY: There is good air exchange bilaterally without wheezing or rales. ABDOMEN: Soft and non-tender with normal pitched bowel sounds.  MUSCULOSKELETAL: There are no major deformities or cyanosis. NEUROLOGIC: No focal weakness or paresthesias are detected. SKIN: There are no ulcers or rashes noted. PSYCHIATRIC: The patient has a normal affect.  DATA:    VENOUS DUPLEX: I have independently interpreted his venous duplex scan today.  This was of the left lower extremity only.  There was no evidence of DVT.  There was deep venous reflux in the common femoral vein.  There was superficial venous reflux in the left great saphenous vein from the saphenofemoral junction to the mid thigh.  Diameters of the vein ranged from 3.0-3.1 mm.  The results of this study are summarized in the diagram below.    RIGHT LOWER EXTREMITY VENOUS DUPLEX: I reviewed the right lower extremity venous duplex scan that was done on 07/15/2022.  This showed no evidence of DVT.  There was deep venous reflux in the common femoral vein.  There was reflux in the right great saphenous vein in the proximal thigh which was dilated up to 5 mm.  This then gave off a large tributary which fed the varicosities along the lateral aspect of the leg.     Deitra Mayo [facility] [phone]

## 2022-12-29 ENCOUNTER — Telehealth: Payer: Self-pay | Admitting: *Deleted

## 2022-12-29 NOTE — Telephone Encounter (Signed)
Left detailed telephone voice message with results from peer to peer review between Dr. Scot Dock and [facility] medical director regarding denial of CPT [id number] (right greater saphenous vein) and 37766 (right leg) Service Reference# [id number].  [facility] medical director upheld denial because venous reflux exam did not demonstrate SFJ reflux and vein diameters <5.5 cm. [facility] medical director recommended a repeat venous reflux study of right leg.  Asked Timothy Avery to call me if he had any questions regarding the peer to peer decision and whether he wanted to proceed with the repeat venous reflux study of the right leg.

## 2023-02-11 ENCOUNTER — Other Ambulatory Visit: Payer: Self-pay

## 2023-02-11 ENCOUNTER — Ambulatory Visit
Admission: EM | Admit: 2023-02-11 | Discharge: 2023-02-11 | Disposition: A | Discharge status: Home / Self Care | Payer: 59 | Attending: Family Medicine | Admitting: Family Medicine

## 2023-02-11 ENCOUNTER — Encounter: Payer: Self-pay | Admitting: Emergency Medicine

## 2023-02-11 DIAGNOSIS — J309 Allergic rhinitis, unspecified: Secondary | ICD-10-CM

## 2023-02-11 MED ORDER — PREDNISONE 20 MG PO TABS
40.0000 mg | ORAL_TABLET | Freq: Every day | ORAL | 0 refills | Status: AC
Start: 2023-02-11 — End: ?

## 2023-02-11 MED ORDER — CETIRIZINE HCL 10 MG PO TABS: 10.0000 mg | ORAL_TABLET | Freq: Every day | ORAL | 2 refills | Status: AC

## 2023-02-11 MED ORDER — FLUTICASONE PROPIONATE 50 MCG/ACT NA SUSP
1.0000 | Freq: Two times a day (BID) | NASAL | 2 refills | Status: AC
Start: 2023-02-11 — End: ?

## 2023-02-11 NOTE — ED Triage Notes (Signed)
Pt reports nasal congestion and left sided facial pain. Denies any fever.

## 2023-02-11 NOTE — ED Provider Notes (Signed)
[facility]    CSN: [id number] Arrival date & time: 02/11/23  0815      History   Chief Complaint Chief Complaint  Patient presents with   Nasal Congestion    HPI Timothy Avery is a 57 y.o. male.   Presenting today with 2 to 3-day history of nasal congestion, left-sided sinus pain and pressure, scratchy throat, cough.  Denies fever, chills, chest pain, shortness of breath, abdominal pain, nausea vomiting and diarrhea.  So far trying Aleve-D with minimal relief.  States every spring these issues happened to him.  No known sick contacts recently.    Past Medical History:  Diagnosis Date   High cholesterol    Thyroid disease     Patient Active Problem List   Diagnosis Date Noted   Snoring 04/12/2021   Restless legs 04/12/2021    History reviewed. No pertinent surgical history.     Home Medications    Prior to Admission medications   Medication Sig Start Date End Date Taking? Authorizing Provider  cetirizine (ZYRTEC ALLERGY) 10 MG tablet Take 1 tablet (10 mg total) by mouth daily. 02/11/23  Yes Volney American, PA-C  fluticasone [facility]) 50 MCG/ACT nasal spray Place 1 spray into both nostrils 2 (two) times daily. 02/11/23  Yes Volney American, PA-C  predniSONE (DELTASONE) 20 MG tablet Take 2 tablets (40 mg total) by mouth daily with breakfast. 02/11/23  Yes Volney American, PA-C  Apremilast 30 MG TABS Take 1 tablet by mouth daily.    [provider]  atorvastatin (LIPITOR) 20 MG tablet Take 1 tablet by mouth daily. 03/18/21   [provider]  clobetasol cream (TEMOVATE) [id number] % Apply 1 Application topically 2 (two) times daily. 07/05/22   [provider]  clonazePAM (KLONOPIN) 0.5 MG tablet Take 0.5 mg by mouth daily as needed. 03/18/21   [provider]  diazepam (VALIUM) 10 MG tablet Take 10 mg by mouth daily as needed. 07/01/22   [provider]  Fenofibrate Micronized 90 MG CAPS Take 1 tablet  by mouth daily.    [provider]  levothyroxine (SYNTHROID) 50 MCG tablet Take 50 mcg by mouth daily. 06/14/22   [provider]  MAGNESIUM PO Take 1 tablet by mouth daily.    [provider]  Omega-3 Fatty Acids (FISH OIL PO) Take 1 capsule by mouth daily.    [provider]  omeprazole (PRILOSEC) 40 MG capsule Take 40 mg by mouth daily. 06/14/22   [provider]  phentermine (ADIPEX-P) 37.5 MG tablet Take 37.5 mg by mouth daily. 07/01/22   [provider]  Pseudoephedrine-Naproxen Na (ALEVE-D SINUS & COLD PO) Take 1 tablet by mouth daily as needed (sinus congestion).    [provider]  rOPINIRole (REQUIP) 1 MG tablet Take 1 mg by mouth daily. 10/30/14   [provider]    Family History History reviewed. No pertinent family history.  Social History Social History   Tobacco Use   Smoking status: Former    Packs/day: 1    Types: Cigarettes    Quit date: 04/10/2018    Years since quitting: 4.8   Smokeless tobacco: Never  Vaping Use   Vaping Use: Never used  Substance Use Topics   Alcohol use: No     Allergies   Patient has no known allergies.   Review of Systems Review of Systems PER HPI  Physical Exam Triage Vital Signs ED Triage Vitals  Enc Vitals Group  BP 02/11/23 0822 (!) 147/78     Pulse Rate 02/11/23 0822 78     Resp 02/11/23 0822 18     Temp 02/11/23 0822 97.8 F (36.6 C)     Temp Source 02/11/23 0822 Oral     SpO2 02/11/23 0822 98 %     Weight --      Height --      Head Circumference --      Peak Flow --      Pain Score 02/11/23 0825 5     Pain Loc --      Pain Edu? --      Excl. in [place]? --    No data found.  Updated Vital Signs BP (!) 147/78 (BP Location: Right Arm)   Pulse 78   Temp 97.8 F (36.6 C) (Oral)   Resp 18   SpO2 98%   Visual Acuity Right Eye Distance:   Left Eye Distance:   Bilateral Distance:    Right Eye Near:   Left Eye Near:    Bilateral Near:      Physical Exam Vitals and nursing note reviewed.  Constitutional:      Appearance: He is well-developed.  HENT:     Head: Atraumatic.     Right Ear: Tympanic membrane and external ear normal.     Left Ear: Tympanic membrane and external ear normal.     Nose: Rhinorrhea present.     Mouth/Throat:     Pharynx: Posterior oropharyngeal erythema present. No oropharyngeal exudate.  Eyes:     Conjunctiva/sclera: Conjunctivae normal.     Pupils: Pupils are equal, round, and reactive to light.  Cardiovascular:     Rate and Rhythm: Normal rate and regular rhythm.  Pulmonary:     Effort: Pulmonary effort is normal. No respiratory distress.     Breath sounds: No wheezing or rales.  Musculoskeletal:        General: Normal range of motion.     Cervical back: Normal range of motion and neck supple.  Lymphadenopathy:     Cervical: No cervical adenopathy.  Skin:    General: Skin is warm and dry.  Neurological:     Mental Status: He is alert and oriented to person, place, and time.  Psychiatric:        Behavior: Behavior normal.      UC Treatments / Results  Labs (all labs ordered are listed, but only abnormal results are displayed) Labs Reviewed - No data to display  EKG   Radiology No results found.  Procedures Procedures (including critical care time)  Medications Ordered in UC Medications - No data to display  Initial Impression / Assessment and Plan / UC Course  I have reviewed the triage vital signs and the nursing notes.  Pertinent labs & imaging results that were available during my care of the patient were reviewed by me and considered in my medical decision making (see chart for details).     Suspect allergic sinusitis, treat with short course of prednisone, sinus rinses, start good Zyrtec and Flonase regimen daily for allergy control.  Return for worsening symptoms.  Final Clinical Impressions(s) / UC Diagnoses   Final diagnoses:  Allergic sinusitis      Discharge Instructions      Take Zyrtec and twice daily Flonase daily to keep allergy symptoms at [place].  For your current symptoms, we will treat with a short course of prednisone to help with the sinus pressure, inflammation.  Use sinus  rinses, humidifiers additionally as needed    ED Prescriptions     Medication Sig Dispense Auth. Provider   predniSONE (DELTASONE) 20 MG tablet Take 2 tablets (40 mg total) by mouth daily with breakfast. 6 tablet Volney American, PA-C   fluticasone [facility]) 50 MCG/ACT nasal spray Place 1 spray into both nostrils 2 (two) times daily. 16 g Volney American, [place]   cetirizine (ZYRTEC ALLERGY) 10 MG tablet Take 1 tablet (10 mg total) by mouth daily. 30 tablet Volney American, [place]      PDMP not reviewed this encounter.   Volney American, [place] 02/11/23 [phone]

## 2023-02-11 NOTE — Discharge Instructions (Signed)
Take Zyrtec and twice daily Flonase daily to keep allergy symptoms at [place].  For your current symptoms, we will treat with a short course of prednisone to help with the sinus pressure, inflammation.  Use sinus rinses, humidifiers additionally as needed

## 2023-12-01 ENCOUNTER — Ambulatory Visit
Admission: EM | Admit: 2023-12-01 | Discharge: 2023-12-01 | Disposition: A | Discharge status: Home / Self Care | Payer: 59 | Attending: Family Medicine | Admitting: Family Medicine

## 2023-12-01 ENCOUNTER — Other Ambulatory Visit: Payer: Self-pay

## 2023-12-01 DIAGNOSIS — J208 Acute bronchitis due to other specified organisms: Secondary | ICD-10-CM

## 2023-12-01 LAB — POCT INFLUENZA A/B
Influenza A, POC: NEGATIVE
Influenza B, POC: NEGATIVE

## 2023-12-01 MED ORDER — PROMETHAZINE-DM 6.25-15 MG/5ML PO SYRP: 5.0000 mL | ORAL_SOLUTION | Freq: Four times a day (QID) | ORAL | 0 refills | Status: AC | PRN

## 2023-12-01 MED ORDER — PREDNISONE 20 MG PO TABS: 40.0000 mg | ORAL_TABLET | Freq: Every day | ORAL | 0 refills | Status: AC

## 2023-12-01 NOTE — ED Triage Notes (Signed)
 Pt reports nasal/chest congestion, fever, body aches for last several days.

## 2023-12-01 NOTE — ED Provider Notes (Signed)
 [facility]    CSN: [id number] Arrival date & time: 12/01/23  0903      History   Chief Complaint Chief Complaint  Patient presents with   Nasal Congestion    HPI Timothy Avery is a 58 y.o. male.   Patient presenting today with several day history of fever, body aches, nasal and chest congestion, hacking productive cough.  States the cough worsened last night but seems to be a bit better this morning.  Denies chest pain, shortness of breath, abdominal pain, nausea vomiting or diarrhea.  Taking Mucinex with minimal relief.  History of pneumonia.  No known sick contacts recently.    Past Medical History:  Diagnosis Date   High cholesterol    Thyroid disease     Patient Active Problem List   Diagnosis Date Noted   Snoring 04/12/2021   Restless legs 04/12/2021    No past surgical history on file.     Home Medications    Prior to Admission medications   Medication Sig Start Date End Date Taking? Authorizing Provider  predniSONE  (DELTASONE ) 20 MG tablet Take 2 tablets (40 mg total) by mouth daily with breakfast. 12/01/23  Yes Stuart Vernell Norris, PA-C  promethazine -dextromethorphan (PROMETHAZINE -DM) 6.25-15 MG/5ML syrup Take 5 mLs by mouth 4 (four) times daily as needed. 12/01/23  Yes Stuart Vernell Norris, PA-C  Apremilast 30 MG TABS Take 1 tablet by mouth daily.    [provider]  atorvastatin (LIPITOR) 20 MG tablet Take 1 tablet by mouth daily. 03/18/21   [provider]  cetirizine  (ZYRTEC  ALLERGY) 10 MG tablet Take 1 tablet (10 mg total) by mouth daily. 02/11/23   Stuart Vernell Norris, PA-C  clobetasol cream (TEMOVATE) 0.05 % Apply 1 Application topically 2 (two) times daily. 07/05/22   [provider]  clonazePAM (KLONOPIN) 0.5 MG tablet Take 0.5 mg by mouth daily as needed. 03/18/21   [provider]  diazepam (VALIUM) 10 MG tablet Take 10 mg by mouth daily as needed. 07/01/22   [provider]  Fenofibrate  Micronized 90 MG CAPS Take 1 tablet by mouth daily.    [provider]  fluticasone  (FLONASE ) 50 MCG/ACT nasal spray Place 1 spray into both nostrils 2 (two) times daily. 02/11/23   Stuart Vernell Norris, PA-C  levothyroxine (SYNTHROID) 50 MCG tablet Take 50 mcg by mouth daily. 06/14/22   [provider]  MAGNESIUM PO Take 1 tablet by mouth daily.    [provider]  Omega-3 Fatty Acids (FISH OIL PO) Take 1 capsule by mouth daily.    [provider]  omeprazole (PRILOSEC) 40 MG capsule Take 40 mg by mouth daily. 06/14/22   [provider]  phentermine (ADIPEX-P) 37.5 MG tablet Take 37.5 mg by mouth daily. 07/01/22   [provider]  predniSONE  (DELTASONE ) 20 MG tablet Take 2 tablets (40 mg total) by mouth daily with breakfast. 02/11/23   Stuart Vernell Norris, PA-C  Pseudoephedrine-Naproxen Na (ALEVE-D SINUS & COLD PO) Take 1 tablet by mouth daily as needed (sinus congestion).    [provider]  rOPINIRole (REQUIP) 1 MG tablet Take 1 mg by mouth daily. 10/30/14   [provider]    Family History No family history on file.  Social History Social History   Tobacco Use   Smoking status: Former    Current packs/day: 0.00    Types: Cigarettes    Quit date: 04/10/2018    Years since quitting: 5.6   Smokeless tobacco:  Never  Vaping Use   Vaping status: Never Used  Substance Use Topics   Alcohol use: No     Allergies   Patient has no known allergies.   Review of Systems Review of Systems Per HPI  Physical Exam Triage Vital Signs ED Triage Vitals  Encounter Vitals Group     BP 12/01/23 0912 (!) 148/79     Systolic BP Percentile --      Diastolic BP Percentile --      Pulse Rate 12/01/23 0912 (!) 111     Resp 12/01/23 0912 20     Temp 12/01/23 0912 98.9 F (37.2 C)     Temp Source 12/01/23 0912 Oral     SpO2 12/01/23 0912 95 %     Weight --      Height --      Head Circumference --      Peak Flow --       Pain Score 12/01/23 0911 6     Pain Loc --      Pain Education --      Exclude from Growth Chart --    No data found.  Updated Vital Signs BP (!) 148/79 (BP Location: Right Arm)   Pulse (!) 111   Temp 98.9 F (37.2 C) (Oral)   Resp 20   SpO2 95%   Visual Acuity Right Eye Distance:   Left Eye Distance:   Bilateral Distance:    Right Eye Near:   Left Eye Near:    Bilateral Near:     Physical Exam Vitals and nursing note reviewed.  Constitutional:      Appearance: He is well-developed.  HENT:     Head: Atraumatic.     Right Ear: External ear normal.     Left Ear: External ear normal.     Nose: Rhinorrhea present.     Mouth/Throat:     Pharynx: Posterior oropharyngeal erythema present. No oropharyngeal exudate.  Eyes:     Conjunctiva/sclera: Conjunctivae normal.     Pupils: Pupils are equal, round, and reactive to light.  Cardiovascular:     Rate and Rhythm: Normal rate and regular rhythm.  Pulmonary:     Effort: Pulmonary effort is normal. No respiratory distress.     Breath sounds: No wheezing or rales.  Musculoskeletal:        General: Normal range of motion.     Cervical back: Normal range of motion and neck supple.  Lymphadenopathy:     Cervical: No cervical adenopathy.  Skin:    General: Skin is warm and dry.  Neurological:     Mental Status: He is alert and oriented to person, place, and time.  Psychiatric:        Behavior: Behavior normal.      UC Treatments / Results  Labs (all labs ordered are listed, but only abnormal results are displayed) Labs Reviewed  POCT INFLUENZA A/B    EKG   Radiology No results found.  Procedures Procedures (including critical care time)  Medications Ordered in UC Medications - No data to display  Initial Impression / Assessment and Plan / UC Course  I have reviewed the triage vital signs and the nursing notes.  Pertinent labs & imaging results that were available during my care of the patient were  reviewed by me and considered in my medical decision making (see chart for details).     Tachycardic and mildly hypertensive in triage, likely secondary to illness.  Rapid flu negative,  declines further viral testing today.  Suspect viral bronchitis.  Treat with prednisone , Phenergan  DM and supportive over-the-counter medications and home care.  Return for any worsening symptoms.  Final Clinical Impressions(s) / UC Diagnoses   Final diagnoses:  Viral bronchitis   Discharge Instructions   None    ED Prescriptions     Medication Sig Dispense Auth. Provider   predniSONE  (DELTASONE ) 20 MG tablet Take 2 tablets (40 mg total) by mouth daily with breakfast. 10 tablet Stuart Vernell Norris, PA-C   promethazine -dextromethorphan (PROMETHAZINE -DM) 6.25-15 MG/5ML syrup Take 5 mLs by mouth 4 (four) times daily as needed. 100 mL Stuart Vernell Norris, [place]      PDMP not reviewed this encounter.   Stuart Vernell Norris, [place] 12/01/23 1021

## 2024-03-12 ENCOUNTER — Ambulatory Visit
Admission: EM | Admit: 2024-03-12 | Discharge: 2024-03-12 | Disposition: A | Discharge status: Home / Self Care | Attending: Family Medicine | Admitting: Family Medicine

## 2024-03-12 ENCOUNTER — Ambulatory Visit (INDEPENDENT_AMBULATORY_CARE_PROVIDER_SITE_OTHER)

## 2024-03-12 DIAGNOSIS — J069 Acute upper respiratory infection, unspecified: Secondary | ICD-10-CM

## 2024-03-12 DIAGNOSIS — R051 Acute cough: Secondary | ICD-10-CM

## 2024-03-12 LAB — POC COVID19/FLU A&B COMBO
Covid Antigen, POC: NEGATIVE
Influenza A Antigen, POC: NEGATIVE
Influenza B Antigen, POC: NEGATIVE

## 2024-03-12 MED ORDER — ALBUTEROL SULFATE HFA 108 (90 BASE) MCG/ACT IN AERS: 2.0000 | INHALATION_SPRAY | RESPIRATORY_TRACT | 0 refills | Status: AC | PRN

## 2024-03-12 MED ORDER — DEXAMETHASONE SODIUM PHOSPHATE 10 MG/ML IJ SOLN
10.0000 mg | Freq: Once | INTRAMUSCULAR | Status: AC
  Administered 2024-03-12: 10 mg via INTRAMUSCULAR

## 2024-03-12 MED ORDER — PROMETHAZINE-DM 6.25-15 MG/5ML PO SYRP: 5.0000 mL | ORAL_SOLUTION | Freq: Four times a day (QID) | ORAL | 0 refills | Status: AC | PRN

## 2024-03-12 MED ORDER — ALBUTEROL SULFATE (2.5 MG/3ML) 0.083% IN NEBU
2.5000 mg | INHALATION_SOLUTION | Freq: Once | RESPIRATORY_TRACT | Status: AC
  Administered 2024-03-12: 2.5 mg via RESPIRATORY_TRACT

## 2024-03-12 NOTE — Discharge Instructions (Signed)
 We will let you know if anything comes back abnormal on your chest x-ray.  We have given you a steroid shot today and a breathing treatment and I have prescribed an albuterol inhaler and cough syrup.  You may continue taking Mucinex and other supportive over-the-counter medications additionally.

## 2024-03-12 NOTE — ED Triage Notes (Addendum)
 Pt reports fever, cough, congestion, body aches, headache, has tried OTC meds and has found no relief x 3 days

## 2024-03-13 NOTE — ED Provider Notes (Signed)
 [facility]    CSN: [id number] Arrival date & time: 03/12/24  0806      History   Chief Complaint No chief complaint on file.   HPI Timothy Avery is a 58 y.o. male.   Patient presenting today with 3-day history of fever, cough, congestion, body aches, headache, wheezing, chest tightness.  Denies chest pain, abdominal pain, vomiting, diarrhea.  So far trying over-the-counter cold and congestion medication with minimal relief.  Denies any known history of chronic pulmonary disease.    Past Medical History:  Diagnosis Date   High cholesterol    Thyroid disease     Patient Active Problem List   Diagnosis Date Noted   Snoring 04/12/2021   Restless legs 04/12/2021    History reviewed. No pertinent surgical history.     Home Medications    Prior to Admission medications   Medication Sig Start Date End Date Taking? Authorizing Provider  albuterol (VENTOLIN HFA) 108 (90 Base) MCG/ACT inhaler Inhale 2 puffs into the lungs every 4 (four) hours as needed. 03/12/24  Yes Particia Nearing, PA-C  promethazine-dextromethorphan (PROMETHAZINE-DM) 6.25-15 MG/5ML syrup Take 5 mLs by mouth 4 (four) times daily as needed. 03/12/24  Yes Particia Nearing, PA-C  Apremilast 30 MG TABS Take 1 tablet by mouth daily.    [provider]  atorvastatin (LIPITOR) 20 MG tablet Take 1 tablet by mouth daily. 03/18/21   [provider]  cetirizine (ZYRTEC ALLERGY) 10 MG tablet Take 1 tablet (10 mg total) by mouth daily. 02/11/23   Particia Nearing, PA-C  clobetasol cream (TEMOVATE) 0.05 % Apply 1 Application topically 2 (two) times daily. 07/05/22   [provider]  clonazePAM (KLONOPIN) 0.5 MG tablet Take 0.5 mg by mouth daily as needed. 03/18/21   [provider]  diazepam (VALIUM) 10 MG tablet Take 10 mg by mouth daily as needed. 07/01/22   [provider]  Fenofibrate Micronized 90 MG CAPS Take 1 tablet by mouth daily.    [provider]  fluticasone (FLONASE) 50 MCG/ACT nasal spray Place 1 spray into both nostrils 2 (two) times daily. 02/11/23   Particia Nearing, PA-C  levothyroxine (SYNTHROID) 50 MCG tablet Take 50 mcg by mouth daily. 06/14/22   [provider]  MAGNESIUM PO Take 1 tablet by mouth daily.    [provider]  Omega-3 Fatty Acids (FISH OIL PO) Take 1 capsule by mouth daily.    [provider]  omeprazole (PRILOSEC) 40 MG capsule Take 40 mg by mouth daily. 06/14/22   [provider]  phentermine (ADIPEX-P) 37.5 MG tablet Take 37.5 mg by mouth daily. 07/01/22   [provider]  predniSONE (DELTASONE) 20 MG tablet Take 2 tablets (40 mg total) by mouth daily with breakfast. 02/11/23   Particia Nearing, PA-C  predniSONE (DELTASONE) 20 MG tablet Take 2 tablets (40 mg total) by mouth daily with breakfast. 12/01/23   Particia Nearing, PA-C  promethazine-dextromethorphan (PROMETHAZINE-DM) 6.25-15 MG/5ML syrup Take 5 mLs by mouth 4 (four) times daily as needed. 12/01/23   Particia Nearing, PA-C  Pseudoephedrine-Naproxen Na (ALEVE-D SINUS & COLD PO) Take 1 tablet by mouth daily as needed (sinus congestion).    [provider]  rOPINIRole (REQUIP) 1 MG tablet Take 1 mg by mouth daily. 10/30/14   [provider]    Family History History reviewed. No pertinent family history.  Social History Social History   Tobacco Use   Smoking status:  Former    Current packs/day: 0.00    Types: Cigarettes    Quit date: 04/10/2018    Years since quitting: 5.9   Smokeless tobacco: Never  Vaping Use   Vaping status: Never Used  Substance Use Topics   Alcohol use: No     Allergies   Patient has no known allergies.   Review of Systems Review of Systems PER HPI  Physical Exam Triage Vital Signs ED Triage Vitals  Encounter Vitals Group     BP 03/12/24 0822 (!) 159/83     Systolic BP Percentile --      Diastolic BP Percentile --       Pulse Rate 03/12/24 0822 (!) 115     Resp 03/12/24 0822 18     Temp 03/12/24 0822 99.2 F (37.3 C)     Temp Source 03/12/24 0822 Oral     SpO2 03/12/24 0822 95 %     Weight --      Height --      Head Circumference --      Peak Flow --      Pain Score 03/12/24 0825 5     Pain Loc --      Pain Education --      Exclude from Growth Chart --    No data found.  Updated Vital Signs BP (!) 159/83 (BP Location: Right Arm)   Pulse (!) 115   Temp 99.2 F (37.3 C) (Oral)   Resp 18   SpO2 95%   Visual Acuity Right Eye Distance:   Left Eye Distance:   Bilateral Distance:    Right Eye Near:   Left Eye Near:    Bilateral Near:     Physical Exam Vitals and nursing note reviewed.  Constitutional:      Appearance: He is well-developed.  HENT:     Head: Atraumatic.     Right Ear: External ear normal.     Left Ear: External ear normal.     Mouth/Throat:     Pharynx: No oropharyngeal exudate.  Eyes:     Conjunctiva/sclera: Conjunctivae normal.     Pupils: Pupils are equal, round, and reactive to light.  Cardiovascular:     Rate and Rhythm: Normal rate and regular rhythm.  Pulmonary:     Effort: Pulmonary effort is normal. No respiratory distress.     Breath sounds: Wheezing present. No rales.  Musculoskeletal:        General: Normal range of motion.     Cervical back: Normal range of motion and neck supple.  Lymphadenopathy:     Cervical: No cervical adenopathy.  Skin:    General: Skin is warm and dry.  Neurological:     Mental Status: He is alert and oriented to person, place, and time.  Psychiatric:        Behavior: Behavior normal.      UC Treatments / Results  Labs (all labs ordered are listed, but only abnormal results are displayed) Labs Reviewed  POC COVID19/FLU A&B COMBO    EKG   Radiology DG Chest 2 View Result Date: 03/12/2024 CLINICAL DATA:  Cough, fever wheezing for 3 days EXAM: CHEST - 2 VIEW COMPARISON:  11/20/2014 x-ray and older FINDINGS:  No consolidation, pneumothorax or effusion. No edema. Normal cardiopericardial silhouette. Degenerative changes of the spine. IMPRESSION: No acute cardiopulmonary disease. Electronically Signed   By: Karen Kays M.D.   On: 03/12/2024 09:50    Procedures Procedures (including critical care time)  Medications Ordered in UC Medications  albuterol (PROVENTIL) (2.5 MG/3ML) 0.083% nebulizer solution 2.5 mg (2.5 mg Nebulization Given 03/12/24 0915)  dexamethasone (DECADRON) injection 10 mg (10 mg Intramuscular Given 03/12/24 0915)    Initial Impression / Assessment and Plan / UC Course  I have reviewed the triage vital signs and the nursing notes.  Pertinent labs & imaging results that were available during my care of the patient were reviewed by me and considered in my medical decision making (see chart for details).     Mildly tachycardic in triage, mildly hypertensive but otherwise vital signs within normal limits.  Does have wheezes on exam so albuterol nebulizer treatment given as well as IM Decadron.  Will treat with Phenergan DM, albuterol inhaler as needed, supportive over-the-counter medications and home care.  COVID and flu testing was negative.  Suspect viral respiratory infection.  Final Clinical Impressions(s) / UC Diagnoses   Final diagnoses:  Acute cough  Viral URI with cough     Discharge Instructions      We will let you know if anything comes back abnormal on your chest x-ray.  We have given you a steroid shot today and a breathing treatment and I have prescribed an albuterol inhaler and cough syrup.  You may continue taking Mucinex and other supportive over-the-counter medications additionally.    ED Prescriptions     Medication Sig Dispense Auth. Provider   albuterol (VENTOLIN HFA) 108 (90 Base) MCG/ACT inhaler Inhale 2 puffs into the lungs every 4 (four) hours as needed. 18 g Tacy Expose [place], PA-C   promethazine-dextromethorphan (PROMETHAZINE-DM) 6.25-15  MG/5ML syrup Take 5 mLs by mouth 4 (four) times daily as needed. 100 mL Corbin Dess, [place]      PDMP not reviewed this encounter.   Corbin Dess, [place] 03/13/24 1550
